# Patient Record
Sex: Female | Born: 1970 | Race: Black or African American | Hispanic: No | Marital: Single | Smoking: Never smoker
Health system: Southern US, Community
[De-identification: ages and names within clinical notes are randomized; demographics above are authoritative.]

---

## 2015-10-28 ENCOUNTER — Ambulatory Visit (HOSPITAL_COMMUNITY)
Admission: EM | Admit: 2015-10-28 | Discharge: 2015-10-28 | Disposition: A | Discharge status: Home / Self Care | Payer: BLUE CROSS/BLUE SHIELD | Attending: Emergency Medicine | Admitting: Emergency Medicine

## 2015-10-28 ENCOUNTER — Ambulatory Visit (INDEPENDENT_AMBULATORY_CARE_PROVIDER_SITE_OTHER): Payer: BLUE CROSS/BLUE SHIELD

## 2015-10-28 ENCOUNTER — Encounter (HOSPITAL_COMMUNITY): Payer: Self-pay | Admitting: Emergency Medicine

## 2015-10-28 DIAGNOSIS — M25462 Effusion, left knee: Secondary | ICD-10-CM | POA: Diagnosis not present

## 2015-10-28 MED ORDER — PREDNISONE 50 MG PO TABS: ORAL_TABLET | ORAL | Status: AC

## 2015-10-28 NOTE — ED Provider Notes (Signed)
CSN: [id number]     Arrival date & time 10/28/15  1617 History   First MD Initiated Contact with Patient 10/28/15 1704     Chief Complaint  Patient presents with  . Leg Pain   (Consider location/radiation/quality/duration/timing/severity/associated sxs/prior Treatment) HPI She is a 45 year old woman here for evaluation of left leg swelling. She states for the last week she has noticed swelling throughout her left leg. She also reports pain, primarily behind her knee. Pain is worse with palpation and full flexion of the knee. Denies any pain in her calf or lower leg. No known injury or trauma. She did have a plane trip, but that was back in February. No recent immobilization. No pelvic or abdominal pain. No abnormal vaginal bleeding.  She does work at a job where she is on her feet for 8 hours, but this is not new. No personal or family history of blood clots.  History reviewed. No pertinent past medical history. History reviewed. No pertinent past surgical history. History reviewed. No pertinent family history. Social History  Substance Use Topics  . Smoking status: Never Smoker   . Smokeless tobacco: None  . Alcohol Use: No   OB History    No data available     Review of Systems As it history of present illness Allergies  Review of patient's allergies indicates no known allergies.  Home Medications   Prior to Admission medications   Medication Sig Start Date End Date Taking? Authorizing Provider  predniSONE (DELTASONE) 50 MG tablet Take 1 pill daily for 5 days. 10/28/15   Charm RingsErin J Mckell Riecke, MD   Meds Ordered and Administered this Visit  Medications - No data to display  BP 156/94 mmHg  Pulse 59  Temp(Src) 97.8 F (36.6 C) (Oral)  Resp 16  SpO2 100%  LMP 09/03/2015 No data found.   Physical Exam  Constitutional: She is oriented to person, place, and time. She appears well-developed and well-nourished. No distress.  Cardiovascular: Normal rate.   Pulmonary/Chest: Effort  normal.  Musculoskeletal: She exhibits no tenderness.  Left knee: No erythema. She does have some swelling from mid calf to mid thigh. This is nonpitting. She does have a small joint effusion. No bony tenderness or point tenderness. She does report discomfort with palpation of the posterior knee. No joint laxity. 2+ DP pulse.  Left calf measures 45.5 cm. Right calf measures 44 cm.  Neurological: She is alert and oriented to person, place, and time.    ED Course  Procedures (including critical care time)  Labs Review Labs Reviewed - No data to display  Imaging Review Dg Knee Complete 4 Views Left  10/28/2015  CLINICAL DATA:  Knee pain for 1 week, no known injury, initial encounter EXAM: LEFT KNEE - COMPLETE 4+ VIEW COMPARISON:  None. FINDINGS: No acute fracture or dislocation is noted. Very minimal degenerative changes are noted in the medial joint space. A small joint effusion is noted. No other focal abnormality is seen. IMPRESSION: Minimal degenerative change with effusion. Electronically Signed   By: Alcide CleverMark  Lukens M.D.   On: 10/28/2015 18:10     MDM   1. Knee effusion, left    Low risk for DVT. Suspect Baker's cyst. Recommend follow-up with orthopedics for additional evaluation and management. We'll do a five-day course of prednisone. Tylenol or ibuprofen as needed for discomfort. Return precautions reviewed.    Charm RingsErin J Helga Asbury, MD 10/28/15 [phone]

## 2015-10-28 NOTE — ED Notes (Signed)
Left leg painful and swollen.  No known injury.  Patient stands a lot at work, patient is not new to this type of work.  Left leg is visibly larger than right leg

## 2015-10-28 NOTE — Discharge Instructions (Signed)
Your x-ray shows some very early arthritis changes as well as some fluid in the knee. I suspect your discomfort and swelling are coming from something called a Baker's cyst. Take prednisone daily for 5 days to help with inflammation. You can take Tylenol or ibuprofen as needed for discomfort. Please make an appointment to see Dr. Ophelia CharterYates, an orthopedic specialist. He'll be able to evaluate you for a baker's cyst. If you develop worsening pain or swelling, the knee becomes red, or you get fevers, please go to the emergency room.

## 2018-02-08 IMAGING — DX DG KNEE COMPLETE 4+V*L*
4 series · 4 of 4 positions shown · non-contrast
Comparison: None.

CLINICAL DATA: Knee pain for 1 week, no known injury, initial
encounter

EXAM:
LEFT KNEE - COMPLETE 4+ VIEW

[knee ap]
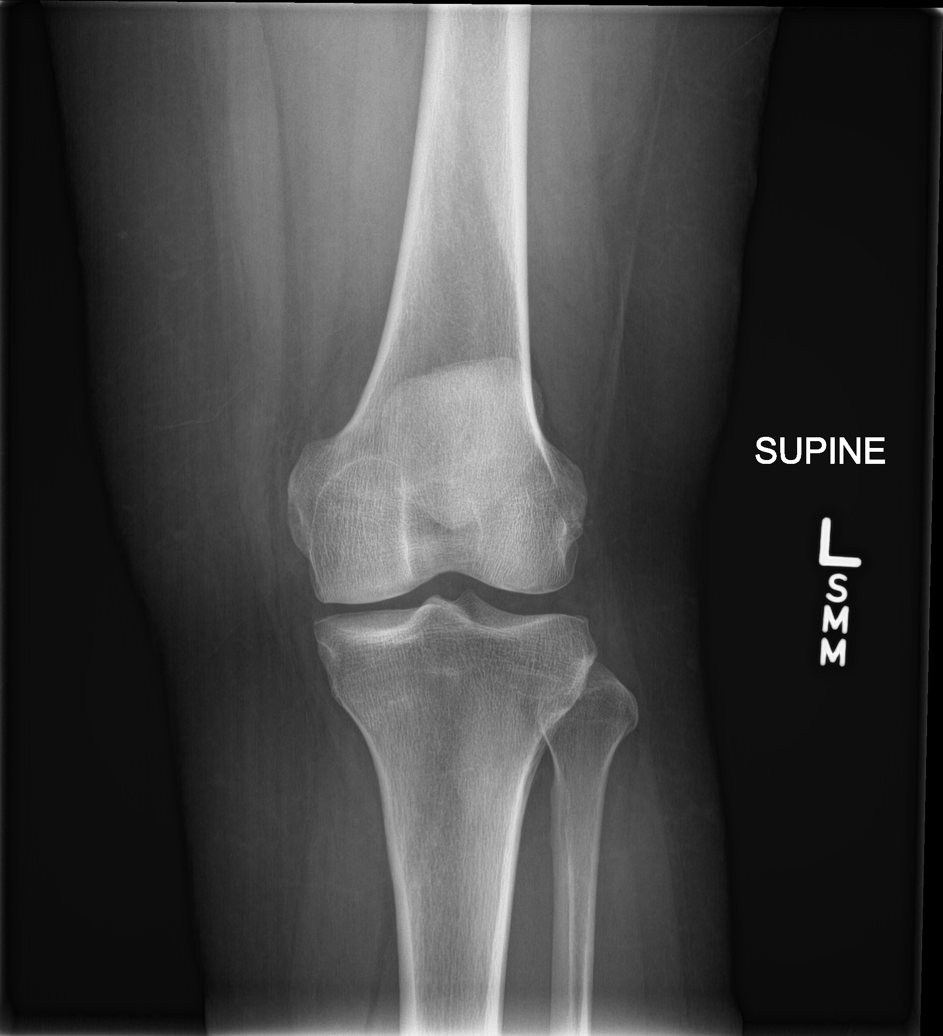

[knee obl (1 of 2)]
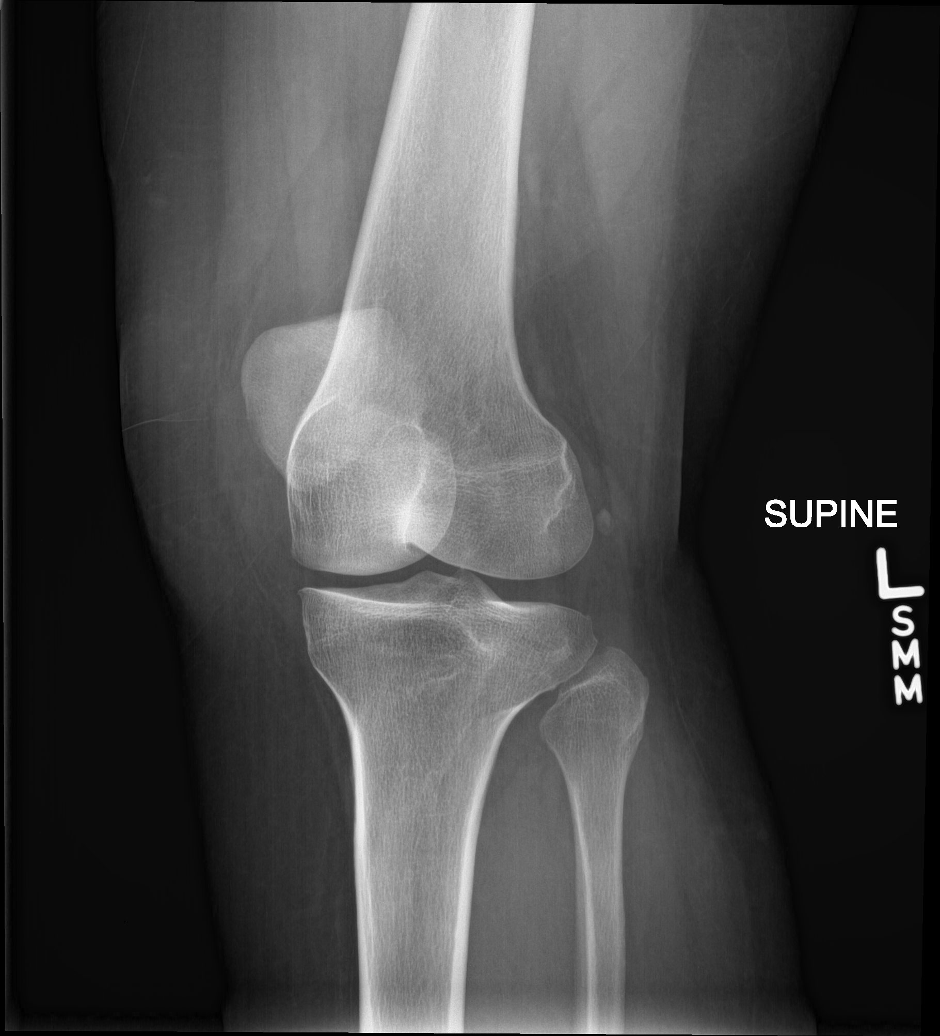

[knee obl (2 of 2)]
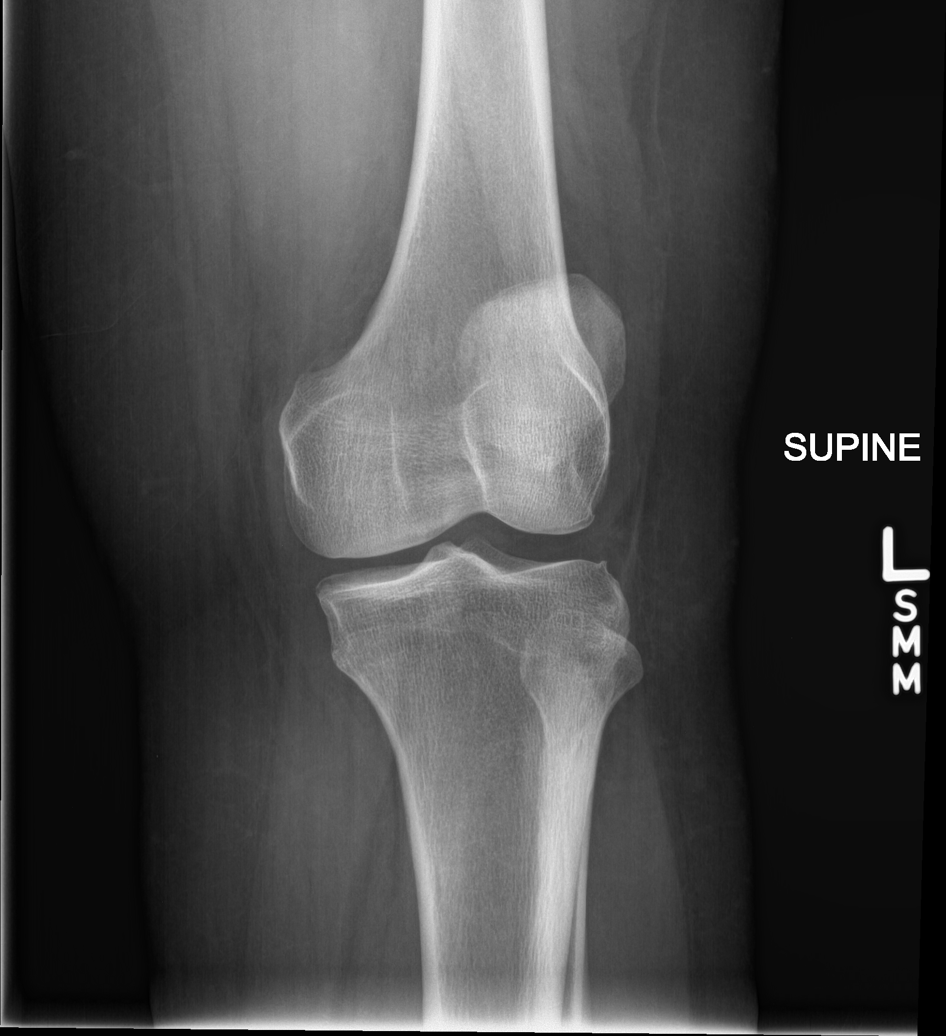

[knee lat]
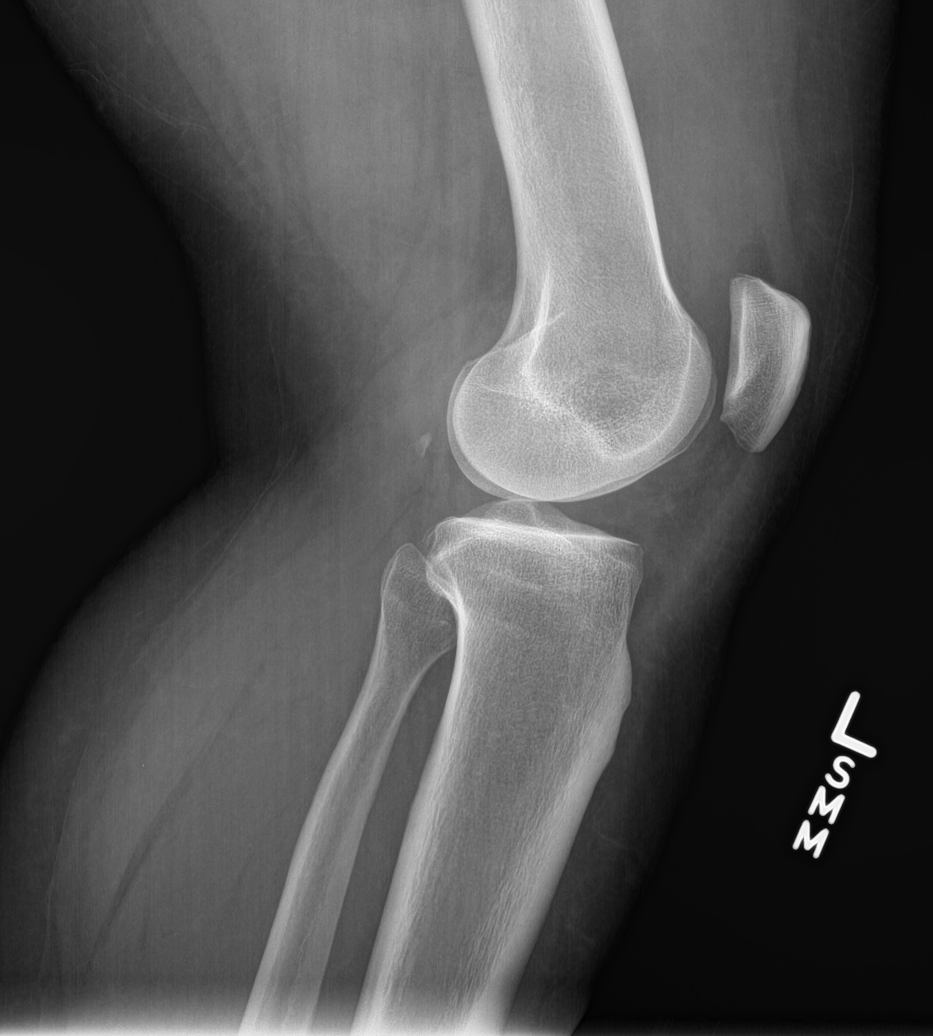

[4 of 4 positions shown; findings below may reference images not displayed]

FINDINGS: No acute fracture or dislocation is noted. Very minimal degenerative
changes are noted in the medial joint space. A small joint effusion
is noted. No other focal abnormality is seen.
IMPRESSION: Minimal degenerative change with effusion.
